# Patient Record
Sex: Female | Born: 1985 | Race: Black or African American | Hispanic: No | Marital: Single | Smoking: Current every day smoker
Health system: Southern US, Community
[De-identification: ages and names within clinical notes are randomized; demographics above are authoritative.]

---

## 2009-11-29 ENCOUNTER — Observation Stay (HOSPITAL_COMMUNITY): Admission: AC | Admit: 2009-11-29 | Discharge: 2009-11-30 | Payer: Self-pay

## 2010-05-29 LAB — POCT CARDIAC MARKERS: Myoglobin, poc: >500 ng/mL (ref 12–200)

## 2010-05-29 LAB — CBC
HCT: 30.1 % — ABNORMAL LOW (ref 36.0–46.0)
MCH: 30.4 pg (ref 26.0–34.0)
MCV: 91 fL (ref 78.0–100.0)
MCV: 91.5 fL (ref 78.0–100.0)
Platelets: 246 10*3/uL (ref 150–400)
Platelets: 299 10*3/uL (ref 150–400)
RBC: 3.29 MIL/uL — ABNORMAL LOW (ref 3.87–5.11)
RBC: 4.02 MIL/uL (ref 3.87–5.11)
WBC: 18 10*3/uL — ABNORMAL HIGH (ref 4.0–10.5)

## 2010-05-29 LAB — BASIC METABOLIC PANEL
BUN: 2 mg/dL — ABNORMAL LOW (ref 6–23)
CO2: 27 mEq/L (ref 19–32)
Chloride: 106 mEq/L (ref 96–112)
Creatinine, Ser: 0.74 mg/dL (ref 0.4–1.2)
GFR calc Af Amer: >60 mL/min (ref >60)
Potassium: 2.9 mEq/L — ABNORMAL LOW (ref 3.5–5.1)

## 2010-05-29 LAB — COMPREHENSIVE METABOLIC PANEL
AST: 35 U/L (ref 0–37)
Albumin: 4 g/dL (ref 3.5–5.2)
Calcium: 9.2 mg/dL (ref 8.4–10.5)
Creatinine, Ser: 1.05 mg/dL (ref 0.4–1.2)
GFR calc Af Amer: >60 mL/min (ref >60)
Total Protein: 7.3 g/dL (ref 6.0–8.3)

## 2010-05-29 LAB — TYPE AND SCREEN: Antibody Screen: NEGATIVE

## 2010-05-29 LAB — CK TOTAL AND CKMB (NOT AT ARMC)
CK, MB: 1.8 ng/mL (ref 0.3–4.0)
Relative Index: 0.5 (ref 0.0–2.5)
Total CK: 330 U/L — ABNORMAL HIGH (ref 7–177)

## 2010-05-29 LAB — TROPONIN I: Troponin I: 0.03 ng/mL (ref 0.00–0.06)

## 2010-05-29 LAB — POCT I-STAT, CHEM 8
Calcium, Ion: 1.14 mmol/L (ref 1.12–1.32)
Hemoglobin: 13.6 g/dL (ref 12.0–15.0)
Sodium: 141 meq/L (ref 135–145)
TCO2: 18 mmol/L (ref 0–100)

## 2010-05-29 LAB — APTT: aPTT: 26 seconds (ref 24–37)

## 2010-05-29 LAB — ABO/RH: ABO/RH(D): A POS

## 2011-12-26 ENCOUNTER — Emergency Department (HOSPITAL_COMMUNITY): Payer: Self-pay | Attending: Physician Assistant

## 2011-12-26 ENCOUNTER — Emergency Department (INDEPENDENT_AMBULATORY_CARE_PROVIDER_SITE_OTHER)
Admission: EM | Admit: 2011-12-26 | Discharge: 2011-12-26 | Disposition: A | Discharge status: Home / Self Care | Payer: Self-pay | Source: Home / Self Care | Attending: Emergency Medicine | Admitting: Emergency Medicine

## 2011-12-26 ENCOUNTER — Encounter (HOSPITAL_COMMUNITY): Payer: Self-pay | Admitting: Emergency Medicine

## 2011-12-26 DIAGNOSIS — M79609 Pain in unspecified limb: Secondary | ICD-10-CM | POA: Insufficient documentation

## 2011-12-26 DIAGNOSIS — S93401A Sprain of unspecified ligament of right ankle, initial encounter: Secondary | ICD-10-CM

## 2011-12-26 DIAGNOSIS — M25579 Pain in unspecified ankle and joints of unspecified foot: Secondary | ICD-10-CM | POA: Insufficient documentation

## 2011-12-26 DIAGNOSIS — S93409A Sprain of unspecified ligament of unspecified ankle, initial encounter: Secondary | ICD-10-CM

## 2011-12-26 MED ORDER — IBUPROFEN 800 MG PO TABS: 800.0000 mg | ORAL_TABLET | Freq: Three times a day (TID) | ORAL | Status: AC

## 2011-12-26 NOTE — ED Notes (Signed)
Pt c/o right foot pain x2 weeks... Denies: trauma/inj to foot, fever, nausea, diarrhea, vomiting... Says the only thing she recalls was wearing high heels x2 weeks and took a couple of mi steps... 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 The pain is usually after long periods of standing or walking... Pt is alert w/no signs of distress.

## 2011-12-26 NOTE — ED Provider Notes (Signed)
History     CSN: [id number]  Arrival date & time 12/26/11  7829   First MD Initiated Contact with Patient 12/26/11 1028      Chief Complaint  Patient presents with  . Foot Pain    (Consider location/radiation/quality/duration/timing/severity/associated sxs/prior treatment) Patient is a 26 y.o. female presenting with ankle pain. The history is provided by the patient. No language interpreter was used.  Ankle Pain  Incident onset: 2 weeks. The incident occurred at home. The injury mechanism was torsion. The pain is present in the right ankle. The quality of the pain is described as aching. The pain is at a severity of 6/10. The pain is moderate. The pain has been constant since onset. She reports no foreign bodies present. She has tried nothing for the symptoms. The treatment provided no relief.  Pt complains of pain in her right ankle.    History reviewed. No pertinent past medical history.  History reviewed. No pertinent past surgical history.  No family history on file.  History  Substance Use Topics  . Smoking status: Current Every Day Smoker -- 0.2 packs/day    Types: Cigarettes  . Smokeless tobacco: Not on file  . Alcohol Use: Yes    OB History    Grav Para Term Preterm Abortions TAB SAB Ect Mult Living                  Review of Systems  Musculoskeletal: Positive for joint swelling.  All other systems reviewed and are negative.    Allergies  Review of patient's allergies indicates no known allergies.  Home Medications  No current outpatient prescriptions on file.  BP 119/82  Pulse 87  Temp 97.6 F (36.4 C) (Oral)  Resp 18  SpO2 99%  LMP 12/19/2011  Physical Exam  Nursing note and vitals reviewed. Constitutional: She appears well-developed and well-nourished.  HENT:  Head: Normocephalic and atraumatic.  Eyes: Conjunctivae normal are normal. Pupils are equal, round, and reactive to light.  Neck: Normal range of motion.  Cardiovascular: Normal  rate.   Pulmonary/Chest: Effort normal.  Abdominal: Soft.  Musculoskeletal: She exhibits tenderness.       Tender right ankle,  Decreased range of motion,  nv and ns intact  Neurological: She is alert.  Skin: Skin is warm.  Psychiatric: She has a normal mood and affect.    ED Course  Procedures (including critical care time)  Labs Reviewed - No data to display Dg Ankle Complete Right  12/26/2011  *RADIOLOGY REPORT*  Clinical Data: Right-sided foot and ankle pain.  RIGHT ANKLE - COMPLETE 3+ VIEW  Comparison: No priors.  Findings: Three views of the right ankle demonstrate no acute fracture, subluxation, dislocation, joint or soft tissue abnormality.  IMPRESSION: 1.  No acute radiographic abnormality of the right ankle to account for the patient's symptoms.   Original Report Authenticated By: Florencia Reasons, M.D.      No diagnosis found.    MDM  Follow up with Dr. Ophelia Charter for evaluation if pain persist.  Wear splint,  Ice,   Ibuprofen x 7 days        Lonia Skinner [place], [place] 12/26/11 1136

## 2011-12-28 NOTE — ED Provider Notes (Signed)
Medical screening examination/treatment/procedure(s) were performed by non-physician practitioner and as supervising physician I was immediately available for consultation/collaboration.  Sarabella Caprio, M.D.   Vernis Cabac[place]gan C Salinda Snedeker, MD 12/28/11 0801 

## 2012-08-15 ENCOUNTER — Encounter (HOSPITAL_COMMUNITY): Payer: Self-pay | Admitting: Emergency Medicine

## 2012-08-15 ENCOUNTER — Emergency Department (INDEPENDENT_AMBULATORY_CARE_PROVIDER_SITE_OTHER)
Admission: EM | Admit: 2012-08-15 | Discharge: 2012-08-15 | Disposition: A | Discharge status: Home / Self Care | Payer: Self-pay | Source: Home / Self Care | Attending: Emergency Medicine | Admitting: Emergency Medicine

## 2012-08-15 DIAGNOSIS — H01003 Unspecified blepharitis right eye, unspecified eyelid: Secondary | ICD-10-CM

## 2012-08-15 DIAGNOSIS — H1012 Acute atopic conjunctivitis, left eye: Secondary | ICD-10-CM

## 2012-08-15 DIAGNOSIS — H01009 Unspecified blepharitis unspecified eye, unspecified eyelid: Secondary | ICD-10-CM

## 2012-08-15 DIAGNOSIS — H1010 Acute atopic conjunctivitis, unspecified eye: Secondary | ICD-10-CM

## 2012-08-15 MED ORDER — TOBRAMYCIN 0.3 % OP OINT: TOPICAL_OINTMENT | Freq: Three times a day (TID) | OPHTHALMIC | Status: AC

## 2012-08-15 NOTE — ED Provider Notes (Addendum)
History     CSN: [id number]  Arrival date & time 08/15/12  1048   First MD Initiated Contact with Patient 08/15/12 1134      Chief Complaint  Patient presents with  . Conjunctivitis    (Consider location/radiation/quality/duration/timing/severity/associated sxs/prior treatment) HPI Comments: Patient presents urgent care describing that since Friday she has been having initially an itchy right eye that since then she has been rubbing. She has noticed itchiness irritation and swelling of her right eye with some puffiness under her right lower eyelid. Denies any changes in vision blurry vision or recent eye injury or trauma. She's able to keep her eye open without any pain, no headache fevers or feeling nauseous.  Patient is a 27 y.o. female presenting with conjunctivitis. The history is provided by the patient.  Conjunctivitis This is a new problem. The current episode started more than 2 days ago. The problem occurs constantly. The problem has been gradually worsening. Pertinent negatives include no chest pain. Exacerbated by: Blinking and rubbing right eye. Nothing relieves the symptoms. She has tried nothing for the symptoms. The treatment provided no relief.    History reviewed. No pertinent past medical history.  History reviewed. No pertinent past surgical history.  No family history on file.  History  Substance Use Topics  . Smoking status: Current Every Day Smoker -- 0.25 packs/day    Types: Cigarettes  . Smokeless tobacco: Not on file  . Alcohol Use: Yes    OB History   Grav Para Term Preterm Abortions TAB SAB Ect Mult Living                  Review of Systems  Constitutional: Negative for activity change and appetite change.  Eyes: Positive for discharge, redness and itching. Negative for photophobia and visual disturbance.  Cardiovascular: Negative for chest pain.  Skin: Negative for color change, pallor and rash.  Hematological: Negative for adenopathy.     Allergies  Review of patient's allergies indicates no known allergies.  Home Medications   Current Outpatient Rx  Name  Route  Sig  Dispense  Refill  . ibuprofen (ADVIL,MOTRIN) 800 MG tablet   Oral   Take 1 tablet (800 mg total) by mouth 3 (three) times daily.   21 tablet   0   . tobramycin (TOBREX) 0.3 % ophthalmic ointment   Right Eye   Place into the right eye 3 (three) times daily. Apply 3 times a day under lower eyelid x 7 days   3.5 g   0     BP 121/85  Pulse 83  Temp(Src) 99.1 F (37.3 C) (Oral)  Resp 18  SpO2 96%  LMP 08/02/2012  Physical Exam  Nursing note and vitals reviewed. Constitutional: She appears well-developed and well-nourished.  Eyes: EOM are normal. Pupils are equal, round, and reactive to light. Right eye exhibits discharge and hordeolum. Right eye exhibits no chemosis and no exudate. No foreign body present in the right eye. Left eye exhibits no chemosis, no discharge, no exudate and no hordeolum. No foreign body present in the left eye. Right conjunctiva is injected. Right conjunctiva has no hemorrhage. No scleral icterus. Right eye exhibits normal extraocular motion and no nystagmus. Right pupil is round and reactive.    Cardiovascular: Regular rhythm.   Neurological: She is alert.  Skin: No rash noted.    ED Course  Procedures (including critical care time)  Labs Reviewed - No data to display No results found.   1. Conjunctivitis,  atopic, acute, left   2. Blepharitis of right eye       MDM  Right eye conjunctivitis and blepharitis patient will be started on tobramycin ophthalmic ointment encouraged to followup with the ophthalmologist if no improvement in 48 hours.        Jimmie Molly, MD 08/15/12 1226  Jimmie Molly, MD 08/15/12 [phone]

## 2012-08-15 NOTE — ED Notes (Signed)
Pt c/o poss pink eye onset Friday Sxs include: itchiness and irritation Denies: blurry vision, f/v/n/d She is alert and oriented w/no signs of acute distress.

## 2013-10-11 ENCOUNTER — Encounter (HOSPITAL_COMMUNITY): Payer: Self-pay | Admitting: Emergency Medicine

## 2013-10-11 ENCOUNTER — Emergency Department (HOSPITAL_COMMUNITY): Payer: BC Managed Care – PPO

## 2013-10-11 ENCOUNTER — Emergency Department (HOSPITAL_COMMUNITY)
Admission: EM | Admit: 2013-10-11 | Discharge: 2013-10-11 | Disposition: A | Discharge status: Home / Self Care | Payer: BC Managed Care – PPO | Attending: Emergency Medicine | Admitting: Emergency Medicine

## 2013-10-11 DIAGNOSIS — Y92410 Unspecified street and highway as the place of occurrence of the external cause: Secondary | ICD-10-CM | POA: Diagnosis not present

## 2013-10-11 DIAGNOSIS — S46909A Unspecified injury of unspecified muscle, fascia and tendon at shoulder and upper arm level, unspecified arm, initial encounter: Secondary | ICD-10-CM | POA: Insufficient documentation

## 2013-10-11 DIAGNOSIS — S99919A Unspecified injury of unspecified ankle, initial encounter: Secondary | ICD-10-CM | POA: Diagnosis not present

## 2013-10-11 DIAGNOSIS — F17200 Nicotine dependence, unspecified, uncomplicated: Secondary | ICD-10-CM | POA: Insufficient documentation

## 2013-10-11 DIAGNOSIS — IMO0002 Reserved for concepts with insufficient information to code with codable children: Secondary | ICD-10-CM | POA: Diagnosis not present

## 2013-10-11 DIAGNOSIS — Y9389 Activity, other specified: Secondary | ICD-10-CM | POA: Insufficient documentation

## 2013-10-11 DIAGNOSIS — S8990XA Unspecified injury of unspecified lower leg, initial encounter: Secondary | ICD-10-CM | POA: Insufficient documentation

## 2013-10-11 DIAGNOSIS — S4980XA Other specified injuries of shoulder and upper arm, unspecified arm, initial encounter: Secondary | ICD-10-CM | POA: Diagnosis present

## 2013-10-11 DIAGNOSIS — S6990XA Unspecified injury of unspecified wrist, hand and finger(s), initial encounter: Secondary | ICD-10-CM

## 2013-10-11 DIAGNOSIS — M25512 Pain in left shoulder: Secondary | ICD-10-CM

## 2013-10-11 DIAGNOSIS — S59909A Unspecified injury of unspecified elbow, initial encounter: Secondary | ICD-10-CM | POA: Diagnosis not present

## 2013-10-11 DIAGNOSIS — M25522 Pain in left elbow: Secondary | ICD-10-CM

## 2013-10-11 DIAGNOSIS — M25562 Pain in left knee: Secondary | ICD-10-CM

## 2013-10-11 DIAGNOSIS — S59919A Unspecified injury of unspecified forearm, initial encounter: Secondary | ICD-10-CM

## 2013-10-11 DIAGNOSIS — S99929A Unspecified injury of unspecified foot, initial encounter: Secondary | ICD-10-CM

## 2013-10-11 MED ORDER — CYCLOBENZAPRINE HCL 5 MG PO TABS: 5.0000 mg | ORAL_TABLET | Freq: Three times a day (TID) | ORAL | Status: AC | PRN

## 2013-10-11 MED ORDER — IBUPROFEN 800 MG PO TABS
800.0000 mg | ORAL_TABLET | Freq: Once | ORAL | Status: AC
  Administered 2013-10-11: 800 mg via ORAL
  Filled 2013-10-11: qty 1

## 2013-10-11 MED ORDER — IBUPROFEN 800 MG PO TABS: 800.0000 mg | ORAL_TABLET | Freq: Three times a day (TID) | ORAL | Status: AC
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
## 2013-10-13 NOTE — ED Provider Notes (Signed)
Medical screening examination/treatment/procedure(s) were performed by non-physician practitioner and as supervising physician I was immediately available for consultation/collaboration.   EKG Interpretation None       Hayzlee Mcsorley, MD 10/13/13 2223 

## 2014-01-29 ENCOUNTER — Encounter (HOSPITAL_COMMUNITY): Payer: Self-pay

## 2014-01-29 ENCOUNTER — Emergency Department (HOSPITAL_COMMUNITY)
Admission: EM | Admit: 2014-01-29 | Discharge: 2014-01-29 | Disposition: A | Discharge status: Home / Self Care | Payer: BC Managed Care – PPO | Source: Home / Self Care | Attending: Emergency Medicine | Admitting: Emergency Medicine

## 2014-01-29 DIAGNOSIS — L608 Other nail disorders: Secondary | ICD-10-CM

## 2014-01-29 NOTE — ED Notes (Signed)
Patient states she noted a discoloration of her nails when her last acrylic nails were removed on 10-20

## 2014-01-29 NOTE — ED Provider Notes (Signed)
  Chief Complaint    Nail Problem   History of Present Illness      Natalie Diaz is a 28 year old female who has had acrylic fingernails for the past several years. She went to a new nail technician, about a month ago she noticed that the nails underneath the acrylic nails seem to be discolored. She removed the acrylic nails this past Saturday, 3 days ago and noticed brownish discoloration of the tips of the nails. She denies any pain or itching. She has no skin rash elsewhere.  Review of Systems   Other than as noted above, the patient denies any of the following symptoms: Systemic:  No fever or chills. ENT:  No nasal congestion, rhinorrhea, sore throat, swelling of lips, tongue or throat. Resp:  No cough, wheezing, or shortness of breath.  PMFSH    Past medical history, family history, social history, meds, and allergies were reviewed.   Physical Exam     Vital signs:  BP 125/82 mmHg  Pulse 90  Temp(Src) 98.2 F (36.8 C) (Oral)  Resp 14  SpO2 98% Gen:  Alert, oriented, in no distress. ENT:  Pharynx clear, no intraoral lesions, moist mucous membranes. Lungs:  Clear to auscultation. Skin:  There was no skin rash. There is brownish discoloration of the tips of all the fingernails. There is no thickening of the nails or chalky discoloration.    Assessment    The encounter diagnosis was Discolored nails.  I think this is just a nail discoloration due to chemical reaction and I don't think it is a fungal infection. This should grow out with time. I suggest she avoid use acrylic nails, nail polishes, lacquers, or prolonged immersion in water.  Plan     1.  Meds:  The following meds were prescribed:   New Prescriptions   No medications on file    2.  Patient Education/Counseling:  The patient was given appropriate handouts, self care instructions, and instructed in symptomatic relief.    3.  Follow up:  The patient was told to follow up here if no better in one  month.    Reuben Likesavid C Doaa Kendzierski, MD 01/29/14 [phone]

## 2014-01-29 NOTE — Discharge Instructions (Signed)
Do not use acrylic nails, nail polish or laquer, and avoid prolonged submersion in water.  Take vitamin containing Biotin.

## 2015-08-29 ENCOUNTER — Other Ambulatory Visit: Payer: Self-pay

## 2015-08-29 ENCOUNTER — Other Ambulatory Visit (HOSPITAL_COMMUNITY)
Admission: RE | Admit: 2015-08-29 | Discharge: 2015-08-29 | Disposition: A | Discharge status: Home / Self Care | Payer: 59 | Source: Ambulatory Visit | Attending: Family Medicine | Admitting: Family Medicine

## 2015-08-29 DIAGNOSIS — Z01419 Encounter for gynecological examination (general) (routine) without abnormal findings: Secondary | ICD-10-CM | POA: Diagnosis not present

## 2015-08-29 DIAGNOSIS — F17200 Nicotine dependence, unspecified, uncomplicated: Secondary | ICD-10-CM | POA: Diagnosis not present

## 2015-08-29 DIAGNOSIS — Z0000 Encounter for general adult medical examination without abnormal findings: Secondary | ICD-10-CM | POA: Diagnosis not present

## 2015-09-02 LAB — CYTOLOGY - PAP

## 2015-09-03 DIAGNOSIS — Z131 Encounter for screening for diabetes mellitus: Secondary | ICD-10-CM | POA: Diagnosis not present

## 2015-09-03 DIAGNOSIS — Z0000 Encounter for general adult medical examination without abnormal findings: Secondary | ICD-10-CM | POA: Diagnosis not present

## 2015-09-03 DIAGNOSIS — Z13220 Encounter for screening for lipoid disorders: Secondary | ICD-10-CM | POA: Diagnosis not present

## 2015-09-04 DIAGNOSIS — Z309 Encounter for contraceptive management, unspecified: Secondary | ICD-10-CM | POA: Diagnosis not present

## 2015-11-29 DIAGNOSIS — Z309 Encounter for contraceptive management, unspecified: Secondary | ICD-10-CM | POA: Diagnosis not present

## 2016-02-25 DIAGNOSIS — Z309 Encounter for contraceptive management, unspecified: Secondary | ICD-10-CM | POA: Diagnosis not present

## 2016-05-20 DIAGNOSIS — Z3042 Encounter for surveillance of injectable contraceptive: Secondary | ICD-10-CM | POA: Diagnosis not present

## 2016-07-07 DIAGNOSIS — N898 Other specified noninflammatory disorders of vagina: Secondary | ICD-10-CM | POA: Diagnosis not present

## 2016-07-24 DIAGNOSIS — Z202 Contact with and (suspected) exposure to infections with a predominantly sexual mode of transmission: Secondary | ICD-10-CM | POA: Diagnosis not present

## 2016-07-28 DIAGNOSIS — N760 Acute vaginitis: Secondary | ICD-10-CM | POA: Diagnosis not present

## 2016-08-11 DIAGNOSIS — Z309 Encounter for contraceptive management, unspecified: Secondary | ICD-10-CM | POA: Diagnosis not present

## 2016-11-20 DIAGNOSIS — Z719 Counseling, unspecified: Secondary | ICD-10-CM | POA: Diagnosis not present

## 2016-11-20 DIAGNOSIS — Z309 Encounter for contraceptive management, unspecified: Secondary | ICD-10-CM | POA: Diagnosis not present

## 2016-11-20 DIAGNOSIS — Z3042 Encounter for surveillance of injectable contraceptive: Secondary | ICD-10-CM | POA: Diagnosis not present

## 2017-03-15 DIAGNOSIS — Z309 Encounter for contraceptive management, unspecified: Secondary | ICD-10-CM | POA: Diagnosis not present

## 2017-04-14 DIAGNOSIS — Z202 Contact with and (suspected) exposure to infections with a predominantly sexual mode of transmission: Secondary | ICD-10-CM | POA: Diagnosis not present

## 2017-04-14 DIAGNOSIS — B373 Candidiasis of vulva and vagina: Secondary | ICD-10-CM | POA: Diagnosis not present

## 2017-04-14 DIAGNOSIS — N898 Other specified noninflammatory disorders of vagina: Secondary | ICD-10-CM | POA: Diagnosis not present

## 2017-04-14 DIAGNOSIS — R300 Dysuria: Secondary | ICD-10-CM | POA: Diagnosis not present

## 2017-04-22 DIAGNOSIS — J018 Other acute sinusitis: Secondary | ICD-10-CM | POA: Diagnosis not present

## 2017-06-17 DIAGNOSIS — Z309 Encounter for contraceptive management, unspecified: Secondary | ICD-10-CM | POA: Diagnosis not present

## 2017-09-20 DIAGNOSIS — Z309 Encounter for contraceptive management, unspecified: Secondary | ICD-10-CM | POA: Diagnosis not present

## 2019-04-04 DIAGNOSIS — Z3201 Encounter for pregnancy test, result positive: Secondary | ICD-10-CM | POA: Diagnosis not present

## 2019-04-04 DIAGNOSIS — N912 Amenorrhea, unspecified: Secondary | ICD-10-CM | POA: Diagnosis not present

## 2019-04-05 DIAGNOSIS — Z3201 Encounter for pregnancy test, result positive: Secondary | ICD-10-CM | POA: Diagnosis not present

## 2019-04-05 DIAGNOSIS — N912 Amenorrhea, unspecified: Secondary | ICD-10-CM | POA: Diagnosis not present

## 2019-04-14 DIAGNOSIS — Z3400 Encounter for supervision of normal first pregnancy, unspecified trimester: Secondary | ICD-10-CM | POA: Diagnosis not present

## 2019-04-14 DIAGNOSIS — Z3201 Encounter for pregnancy test, result positive: Secondary | ICD-10-CM | POA: Diagnosis not present

## 2019-04-24 DIAGNOSIS — O021 Missed abortion: Secondary | ICD-10-CM | POA: Diagnosis not present

## 2019-04-24 DIAGNOSIS — O26891 Other specified pregnancy related conditions, first trimester: Secondary | ICD-10-CM | POA: Diagnosis not present

## 2019-05-08 DIAGNOSIS — O039 Complete or unspecified spontaneous abortion without complication: Secondary | ICD-10-CM | POA: Diagnosis not present

## 2019-05-08 DIAGNOSIS — Z30011 Encounter for initial prescription of contraceptive pills: Secondary | ICD-10-CM | POA: Diagnosis not present

## 2019-05-08 DIAGNOSIS — N898 Other specified noninflammatory disorders of vagina: Secondary | ICD-10-CM | POA: Diagnosis not present

## 2019-05-08 DIAGNOSIS — R102 Pelvic and perineal pain: Secondary | ICD-10-CM | POA: Diagnosis not present

## 2019-05-15 MED FILL — VYLIBRA 0.25-35 MG-MCG TABS: 0.25-35 | 84 days supply | Qty: 84 | Fill #0

## 2019-05-22 DIAGNOSIS — O039 Complete or unspecified spontaneous abortion without complication: Secondary | ICD-10-CM | POA: Diagnosis not present

## 2019-06-06 DIAGNOSIS — O039 Complete or unspecified spontaneous abortion without complication: Secondary | ICD-10-CM | POA: Diagnosis not present

## 2019-08-10 MED FILL — FEMYNOR 0.25-35 MG-MCG TABS: 0.25-35 | 84 days supply | Qty: 84 | Fill #1

## 2019-08-21 ENCOUNTER — Other Ambulatory Visit: Payer: Self-pay | Admitting: Obstetrics and Gynecology

## 2019-08-21 DIAGNOSIS — R102 Pelvic and perineal pain: Secondary | ICD-10-CM

## 2019-08-21 DIAGNOSIS — N898 Other specified noninflammatory disorders of vagina: Secondary | ICD-10-CM | POA: Diagnosis not present

## 2019-08-21 DIAGNOSIS — Z01419 Encounter for gynecological examination (general) (routine) without abnormal findings: Secondary | ICD-10-CM | POA: Diagnosis not present

## 2019-08-21 DIAGNOSIS — Z3041 Encounter for surveillance of contraceptive pills: Secondary | ICD-10-CM | POA: Diagnosis not present

## 2019-08-23 ENCOUNTER — Ambulatory Visit
Admission: RE | Admit: 2019-08-23 | Discharge: 2019-08-23 | Disposition: A | Discharge status: Home / Self Care | Payer: 59 | Source: Ambulatory Visit | Attending: Obstetrics and Gynecology | Admitting: Obstetrics and Gynecology

## 2019-08-23 DIAGNOSIS — R102 Pelvic and perineal pain: Secondary | ICD-10-CM

## 2020-07-18 DIAGNOSIS — B373 Candidiasis of vulva and vagina: Secondary | ICD-10-CM | POA: Diagnosis not present

## 2020-07-18 DIAGNOSIS — Z114 Encounter for screening for human immunodeficiency virus [HIV]: Secondary | ICD-10-CM | POA: Diagnosis not present

## 2020-07-18 DIAGNOSIS — Z113 Encounter for screening for infections with a predominantly sexual mode of transmission: Secondary | ICD-10-CM | POA: Diagnosis not present

## 2020-11-11 IMAGING — US US PELVIS COMPLETE WITH TRANSVAGINAL
1 series · 14 of 25 positions shown · non-contrast
Comparison: None

CLINICAL DATA: Pelvic pain for 2 months.

EXAM:
TRANSABDOMINAL AND TRANSVAGINAL ULTRASOUND OF PELVIS
TECHNIQUE: Both transabdominal and transvaginal ultrasound examinations of the
pelvis were performed. Transabdominal technique was performed for
global imaging of the pelvis including uterus, ovaries, adnexal
regions, and pelvic cul-de-sac. It was necessary to proceed with
endovaginal exam following the transabdominal exam to visualize the
endometrium and adnexa.

[Series 1: us pelvis complete with transvaginal · 0.25mm/px · 14 of 38 slices shown]
[im 1/38]
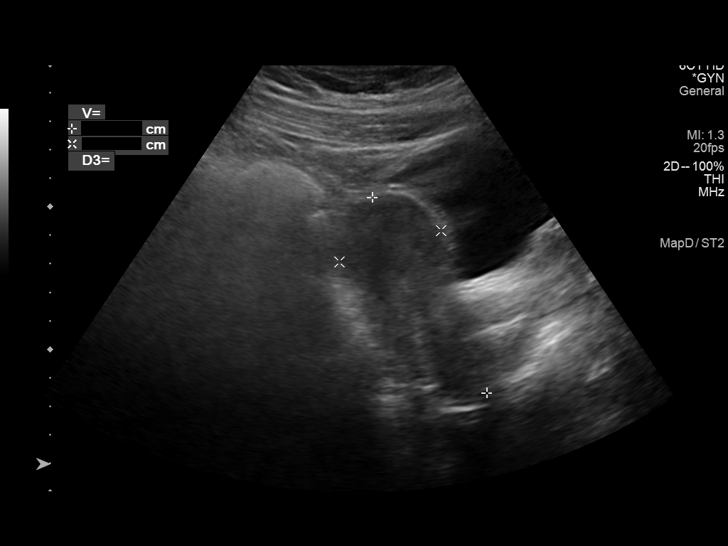
[im 4/38]
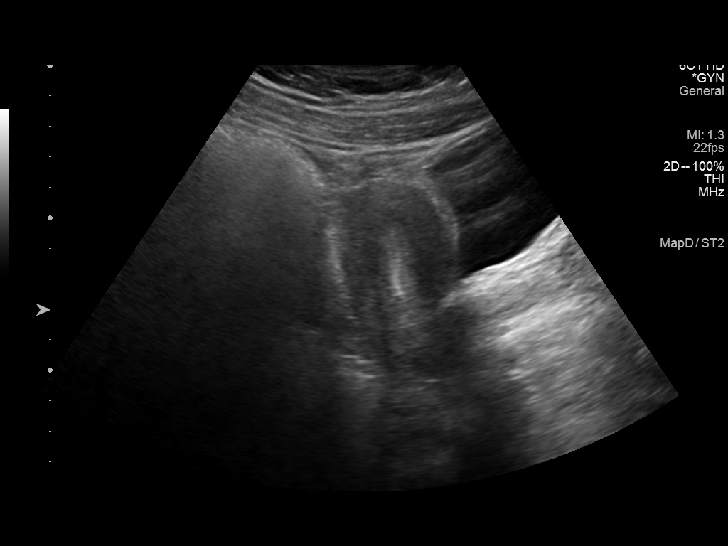
[im 7/38]
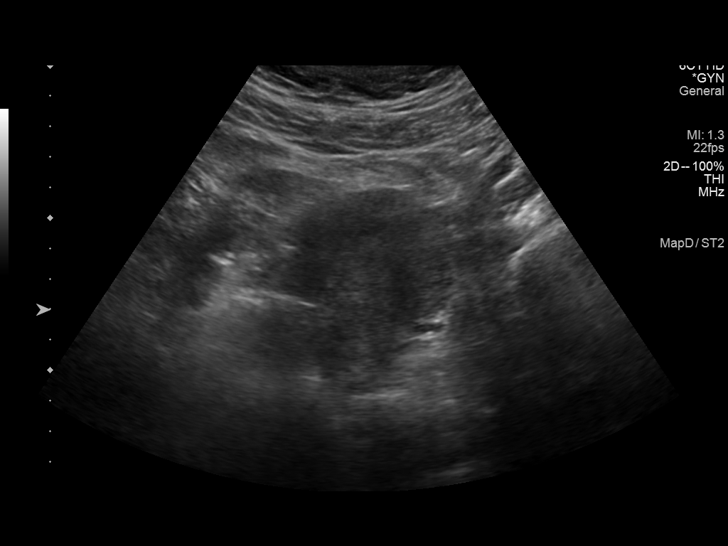
[im 10/38]
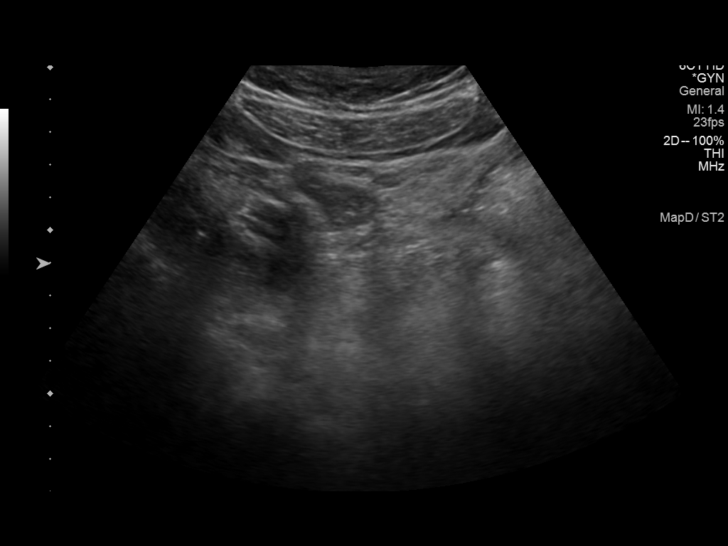
[im 13/38]
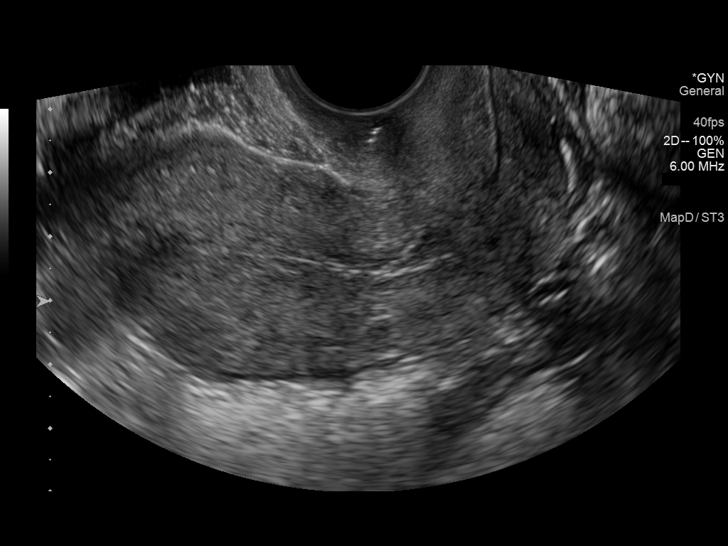
[im 14/38]
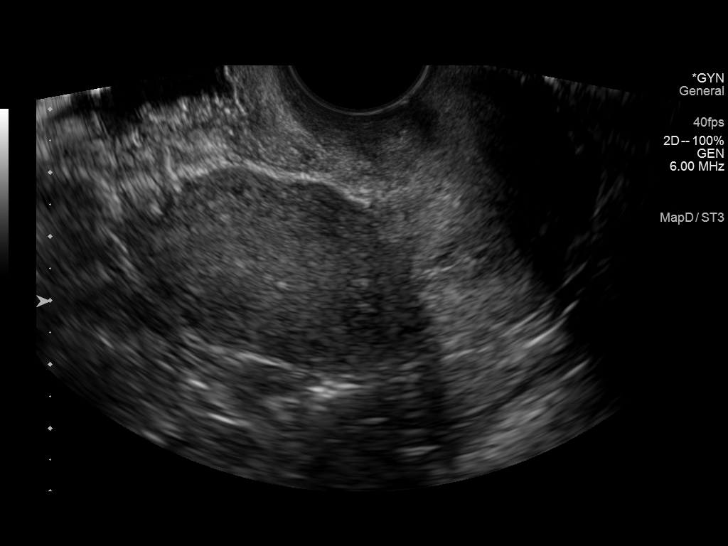
[im 17/38]
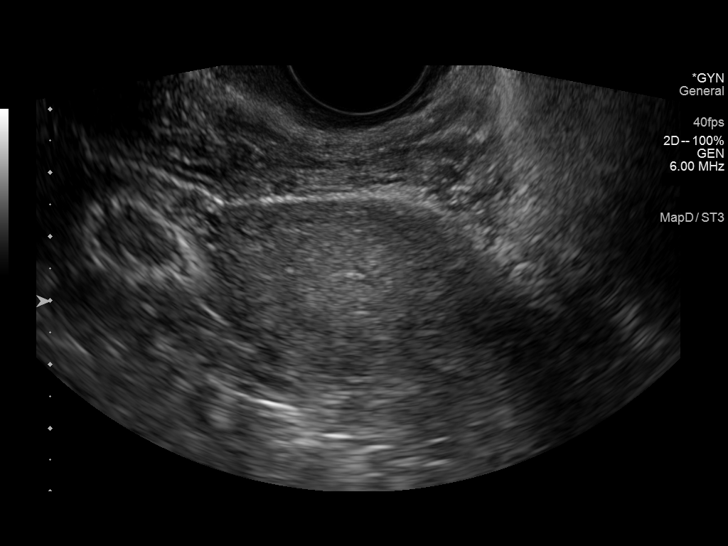
[im 21/38]
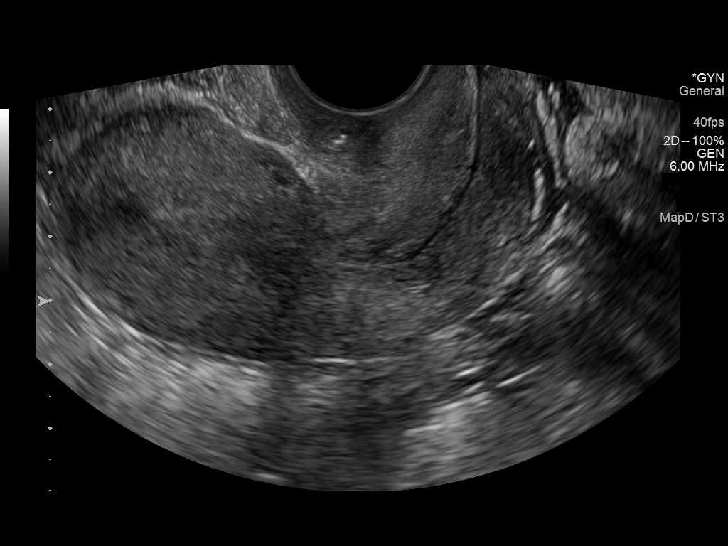
[im 24/38]
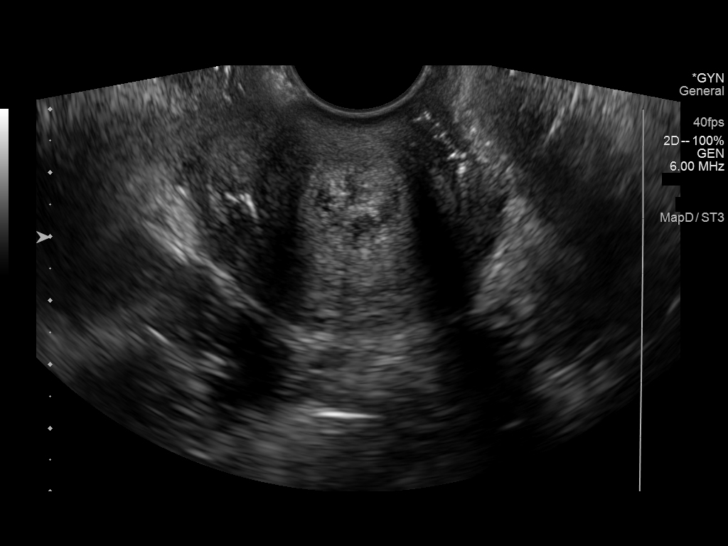
[im 25/38]
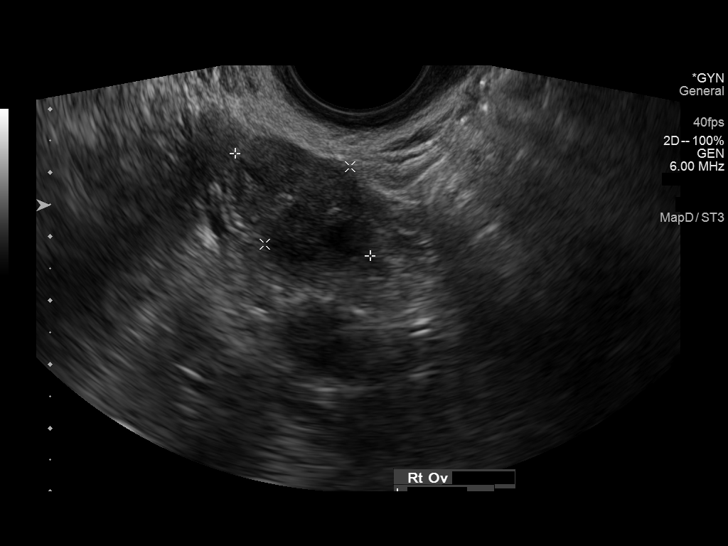
[im 28/38]
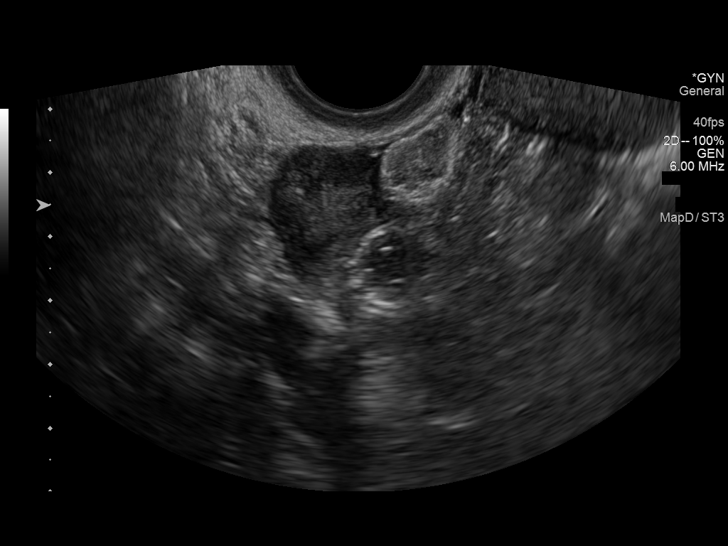
[im 31/38]
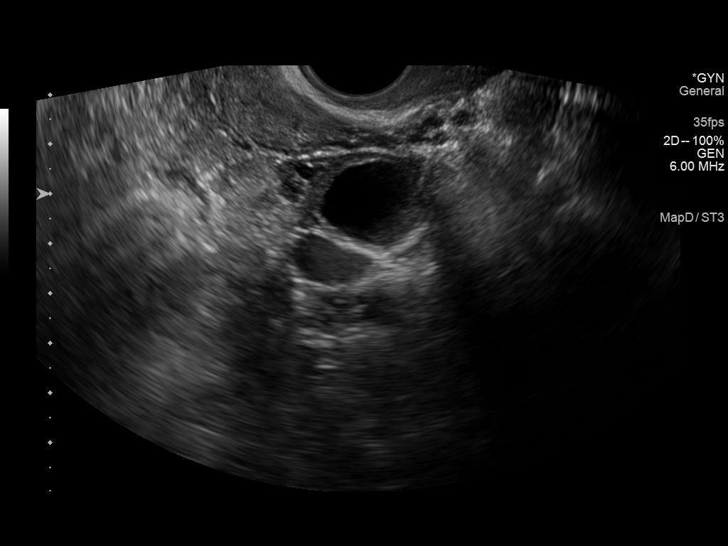
[im 34/38]
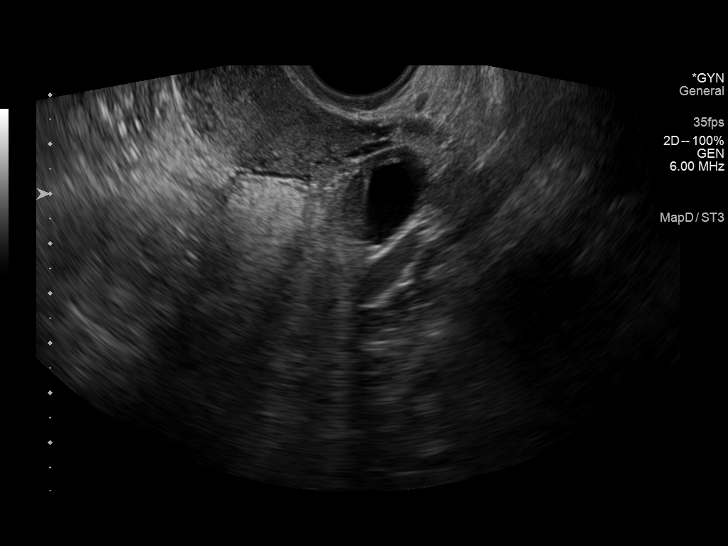
[im 38/38]
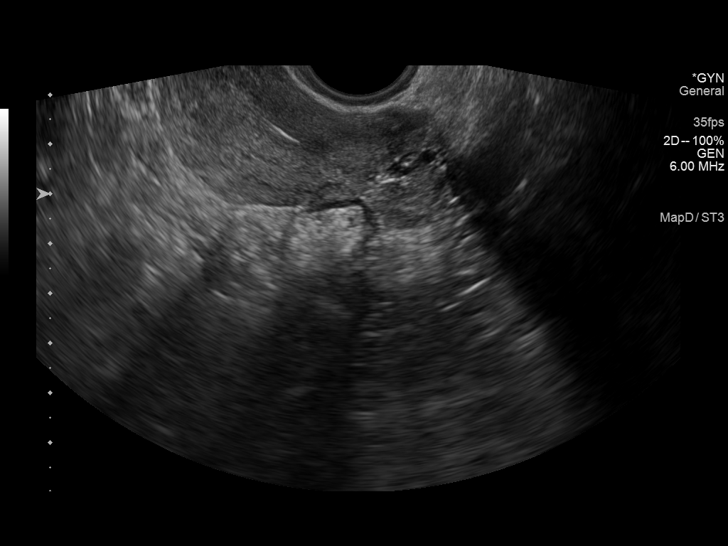

[14 of 25 positions shown; findings below may reference images not displayed]

FINDINGS: Uterus

Measurements: 7.9 x 3.7 x 4.6 cm = volume: 71.5 mL. No fibroids or
other mass visualized.

Endometrium

Thickness: 0.4 cm.  No focal abnormality visualized.

Right ovary

Measurements: 2.7 x 1.8 x 1.6 = volume: 4.1 mL. Normal appearance/no
adnexal mass.

Left ovary

Measurements: 3.1 x 2.0 x 1.6 = volume: 5.2 mL. Normal appearance/no
adnexal mass.

Other findings

No abnormal free fluid.
IMPRESSION: Normal examination.

## 2023-09-08 ENCOUNTER — Ambulatory Visit: Payer: Self-pay | Admitting: Family
